# Patient Record
Sex: Female | Born: 1995 | Race: Black or African American | Hispanic: No | Marital: Single | Smoking: Never smoker
Health system: Southern US, Community
[De-identification: ages and names within clinical notes are randomized; demographics above are authoritative.]

---

## 2015-12-06 ENCOUNTER — Encounter (HOSPITAL_COMMUNITY): Payer: Self-pay | Admitting: Emergency Medicine

## 2015-12-06 ENCOUNTER — Emergency Department (HOSPITAL_COMMUNITY)
Admission: EM | Admit: 2015-12-06 | Discharge: 2015-12-06 | Disposition: A | Discharge status: Home / Self Care | Payer: BC Managed Care – PPO | Attending: Emergency Medicine | Admitting: Emergency Medicine

## 2015-12-06 ENCOUNTER — Emergency Department (HOSPITAL_COMMUNITY): Payer: BC Managed Care – PPO

## 2015-12-06 DIAGNOSIS — R05 Cough: Secondary | ICD-10-CM | POA: Diagnosis present

## 2015-12-06 DIAGNOSIS — J069 Acute upper respiratory infection, unspecified: Secondary | ICD-10-CM

## 2015-12-06 MED ORDER — BENZONATATE 100 MG PO CAPS
100.0000 mg | ORAL_CAPSULE | Freq: Once | ORAL | Status: AC
  Administered 2015-12-06: 100 mg via ORAL
  Filled 2015-12-06: qty 1

## 2015-12-06 MED ORDER — BENZONATATE 100 MG PO CAPS: 100.0000 mg | ORAL_CAPSULE | Freq: Three times a day (TID) | ORAL | Status: AC

## 2015-12-06 MED ORDER — GUAIFENESIN 100 MG/5ML PO LIQD: 100.0000 mg | ORAL | Status: AC | PRN

## 2015-12-06 NOTE — Discharge Instructions (Signed)
Upper Respiratory Infection, Adult Most upper respiratory infections (URIs) are a viral infection of the air passages leading to the lungs. A URI affects the nose, throat, and upper air passages. The most common type of URI is nasopharyngitis and is typically referred to as "the common cold." URIs run their course and usually go away on their own. Most of the time, a URI does not require medical attention, but sometimes a bacterial infection in the upper airways can follow a viral infection. This is called a secondary infection. Sinus and middle ear infections are common types of secondary upper respiratory infections. Bacterial pneumonia can also complicate a URI. A URI can worsen asthma and chronic obstructive pulmonary disease (COPD). Sometimes, these complications can require emergency medical care and may be life threatening.  CAUSES Almost all URIs are caused by viruses. A virus is a type of germ and can spread from one person to another.  RISKS FACTORS You may be at risk for a URI if:   You smoke.   You have chronic heart or lung disease.  You have a weakened defense (immune) system.   You are very young or very old.   You have nasal allergies or asthma.  You work in crowded or poorly ventilated areas.  You work in health care facilities or schools. SIGNS AND SYMPTOMS  Symptoms typically develop 2-3 days after you come in contact with a cold virus. Most viral URIs last 7-10 days. However, viral URIs from the influenza virus (flu virus) can last 14-18 days and are typically more severe. Symptoms may include:   Runny or stuffy (congested) nose.   Sneezing.   Cough.   Sore throat.   Headache.   Fatigue.   Fever.   Loss of appetite.   Pain in your forehead, behind your eyes, and over your cheekbones (sinus pain).  Muscle aches.  DIAGNOSIS  Your health care provider may diagnose a URI by:  Physical exam.  Tests to check that your symptoms are not due to  another condition such as:  Strep throat.  Sinusitis.  Pneumonia.  Asthma. TREATMENT  A URI goes away on its own with time. It cannot be cured with medicines, but medicines may be prescribed or recommended to relieve symptoms. Medicines may help:  Reduce your fever.  Reduce your cough.  Relieve nasal congestion. HOME CARE INSTRUCTIONS   Take medicines only as directed by your health care provider.   Gargle warm saltwater or take cough drops to comfort your throat as directed by your health care provider.  Use a warm mist humidifier or inhale steam from a shower to increase air moisture. This may make it easier to breathe.  Drink enough fluid to keep your urine clear or pale yellow.   Eat soups and other clear broths and maintain good nutrition.   Rest as needed.   Return to work when your temperature has returned to normal or as your health care provider advises. You may need to stay home longer to avoid infecting others. You can also use a face mask and careful hand washing to prevent spread of the virus.  Increase the usage of your inhaler if you have asthma.   Do not use any tobacco products, including cigarettes, chewing tobacco, or electronic cigarettes. If you need help quitting, ask your health care provider. PREVENTION  The best way to protect yourself from getting a cold is to practice good hygiene.   Avoid oral or hand contact with people with cold   symptoms.   Wash your hands often if contact occurs.  There is no clear evidence that vitamin C, vitamin E, echinacea, or exercise reduces the chance of developing a cold. However, it is always recommended to get plenty of rest, exercise, and practice good nutrition.  SEEK MEDICAL CARE IF:   You are getting worse rather than better.   Your symptoms are not controlled by medicine.   You have chills.  You have worsening shortness of breath.  You have brown or red mucus.  You have yellow or brown nasal  discharge.  You have pain in your face, especially when you bend forward.  You have a fever.  You have swollen neck glands.  You have pain while swallowing.  You have white areas in the back of your throat. SEEK IMMEDIATE MEDICAL CARE IF:   You have severe or persistent:  Headache.  Ear pain.  Sinus pain.  Chest pain.  You have chronic lung disease and any of the following:  Wheezing.  Prolonged cough.  Coughing up blood.  A change in your usual mucus.  You have a stiff neck.  You have changes in your:  Vision.  Hearing.  Thinking.  Mood. MAKE SURE YOU:   Understand these instructions.  Will watch your condition.  Will get help right away if you are not doing well or get worse.   This information is not intended to replace advice given to you by your health care provider. Make sure you discuss any questions you have with your health care provider.   Document Released: 02/17/2001 Document Revised: 01/08/2015 Document Reviewed: 11/29/2013 Elsevier Interactive Patient Education 2016 [employer]  

## 2015-12-06 NOTE — ED Notes (Addendum)
Verbalized understanding discharge instructions, prescriptions, and follow-up. In no acute distress.   Pt given work and school notes.

## 2015-12-06 NOTE — ED Notes (Signed)
Per pt, states cough which is making her "lungs hurt"-started yesterday

## 2015-12-06 NOTE — ED Provider Notes (Signed)
CSN: [id number]     Arrival date & time 12/06/15  [id number] History   First MD Initiated Contact with Patient 12/06/15 0848     Chief Complaint  Patient presents with  . Cough     Patient is a 20 y.o. female presenting with cough. The history is provided by the patient. No language interpreter was used.  Cough  Julia Lucas is a 20 y.o. female who presents to the Emergency Department complaining of cough.  She reports dry and nonproductive cough that started yesterday. She has pain in her lungs with coughing and occasionally with breathing across her upper central chest. She has intermittent runny nose. No fevers, vomiting, diarrhea, leg swelling. She does have some nausea and feels like she is going to throw up and she has long coughing fits. She does have coworkers that are sick with similar symptoms. No history of DVT/PE, no hormone use.  History reviewed. No pertinent past medical history. History reviewed. No pertinent past surgical history. No family history on file. Social History  Substance Use Topics  . Smoking status: Never Smoker   . Smokeless tobacco: None  . Alcohol Use: Yes   OB History    No data available     Review of Systems  Respiratory: Positive for cough.   All other systems reviewed and are negative.     Allergies  Other  Home Medications   Prior to Admission medications   Medication Sig Start Date End Date Taking? Authorizing Provider  Menthol (COUGH DROPS) 10 MG LOZG Use as directed 1 lozenge in the mouth or throat as needed (for cough).   Yes Historical Provider, MD  benzonatate (TESSALON) 100 MG capsule Take 1 capsule (100 mg total) by mouth every 8 (eight) hours. 12/06/15   Tilden FossaElizabeth Davonne Baby, MD  guaiFENesin (ROBITUSSIN) 100 MG/5ML liquid Take 5-10 mLs (100-200 mg total) by mouth every 4 (four) hours as needed for cough. 12/06/15   Tilden FossaElizabeth Lanyah Spengler, MD   BP 124/82 mmHg  Pulse 73  Temp(Src) 98.1 F (36.7 C) (Oral)  Resp 16  SpO2 98%  LMP  11/06/2015 Physical Exam  Constitutional: She is oriented to person, place, and time. She appears well-developed and well-nourished.  HENT:  Head: Normocephalic and atraumatic.  Mild erythema and tonsillar swelling in the posterior oropharynx.  Cardiovascular: Normal rate and regular rhythm.   No murmur heard. Pulmonary/Chest: Effort normal and breath sounds normal. No respiratory distress.  Abdominal: Soft. There is no tenderness. There is no rebound and no guarding.  Musculoskeletal: She exhibits no edema or tenderness.  Neurological: She is alert and oriented to person, place, and time.  Skin: Skin is warm and dry.  Psychiatric: She has a normal mood and affect. Her behavior is normal.  Nursing note and vitals reviewed.   ED Course  Procedures (including critical care time) Labs Review Labs Reviewed - No data to display  Imaging Review Dg Chest 2 View  12/06/2015  CLINICAL DATA:  Cough, mid chest pain since yesterday, nonsmoker. EXAM: CHEST  2 VIEW COMPARISON:  None in FINDINGS: The lungs are adequately inflated. There is no focal infiltrate. There is no pleural effusion. The heart and pulmonary vascularity are normal. The mediastinum is normal in width. The bony thorax exhibits no acute abnormality. IMPRESSION: There is no active cardiopulmonary disease. Electronically Signed   By: David  SwazilandJordan M.D.   On: 12/06/2015 09:16   I have personally reviewed and evaluated these images and lab results as part of my medical  decision-making.   EKG Interpretation None      MDM   Final diagnoses:  Acute URI    Patient here for evaluation of cough with chest discomfort. No evidence of pneumonia, asthma. Presentation is not consistent with PE, perc negative. Treating for URI with antitussives. Discussed home care, outpatient follow-up, return precautions.    Tilden Fossa, MD 12/06/15 [phone]

## 2016-09-18 IMAGING — CR DG CHEST 2V
2 series · 2 of 2 positions shown · non-contrast
Comparison: None in

CLINICAL DATA: Cough, mid chest pain since yesterday, nonsmoker.

EXAM:
CHEST  2 VIEW

[w chest pa]
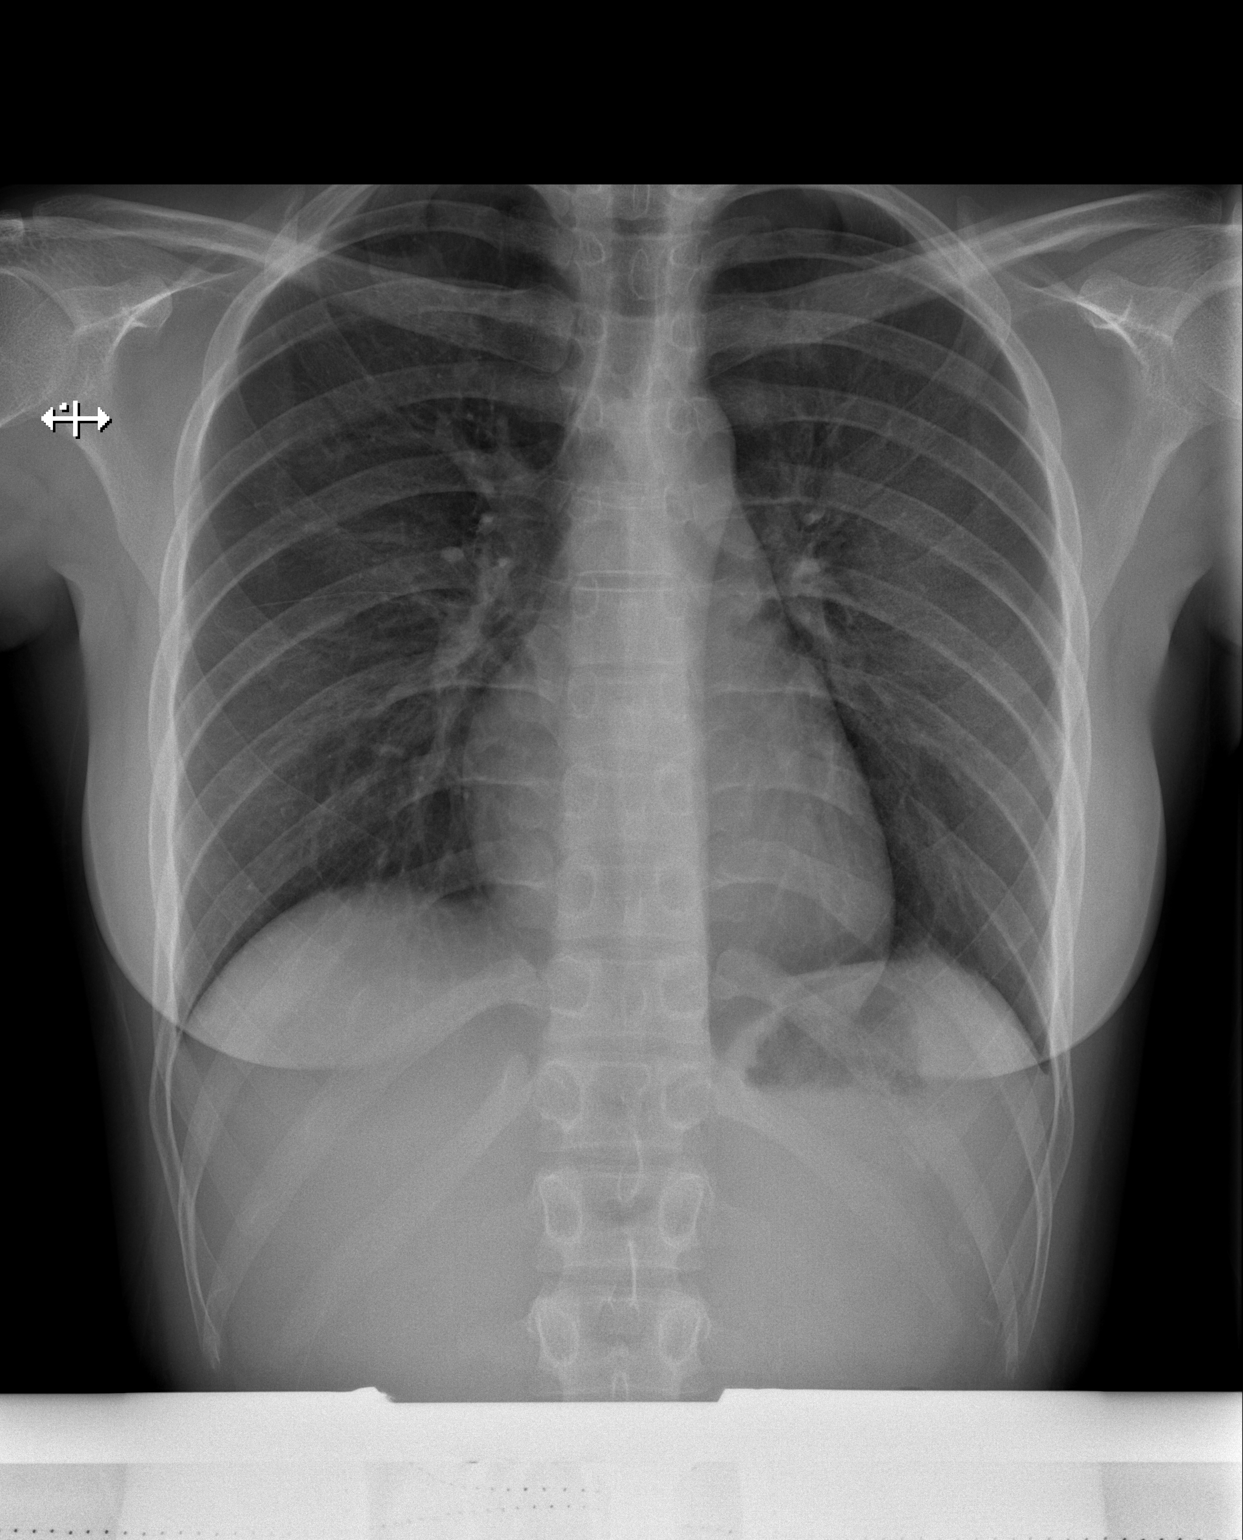

[w chest lat]
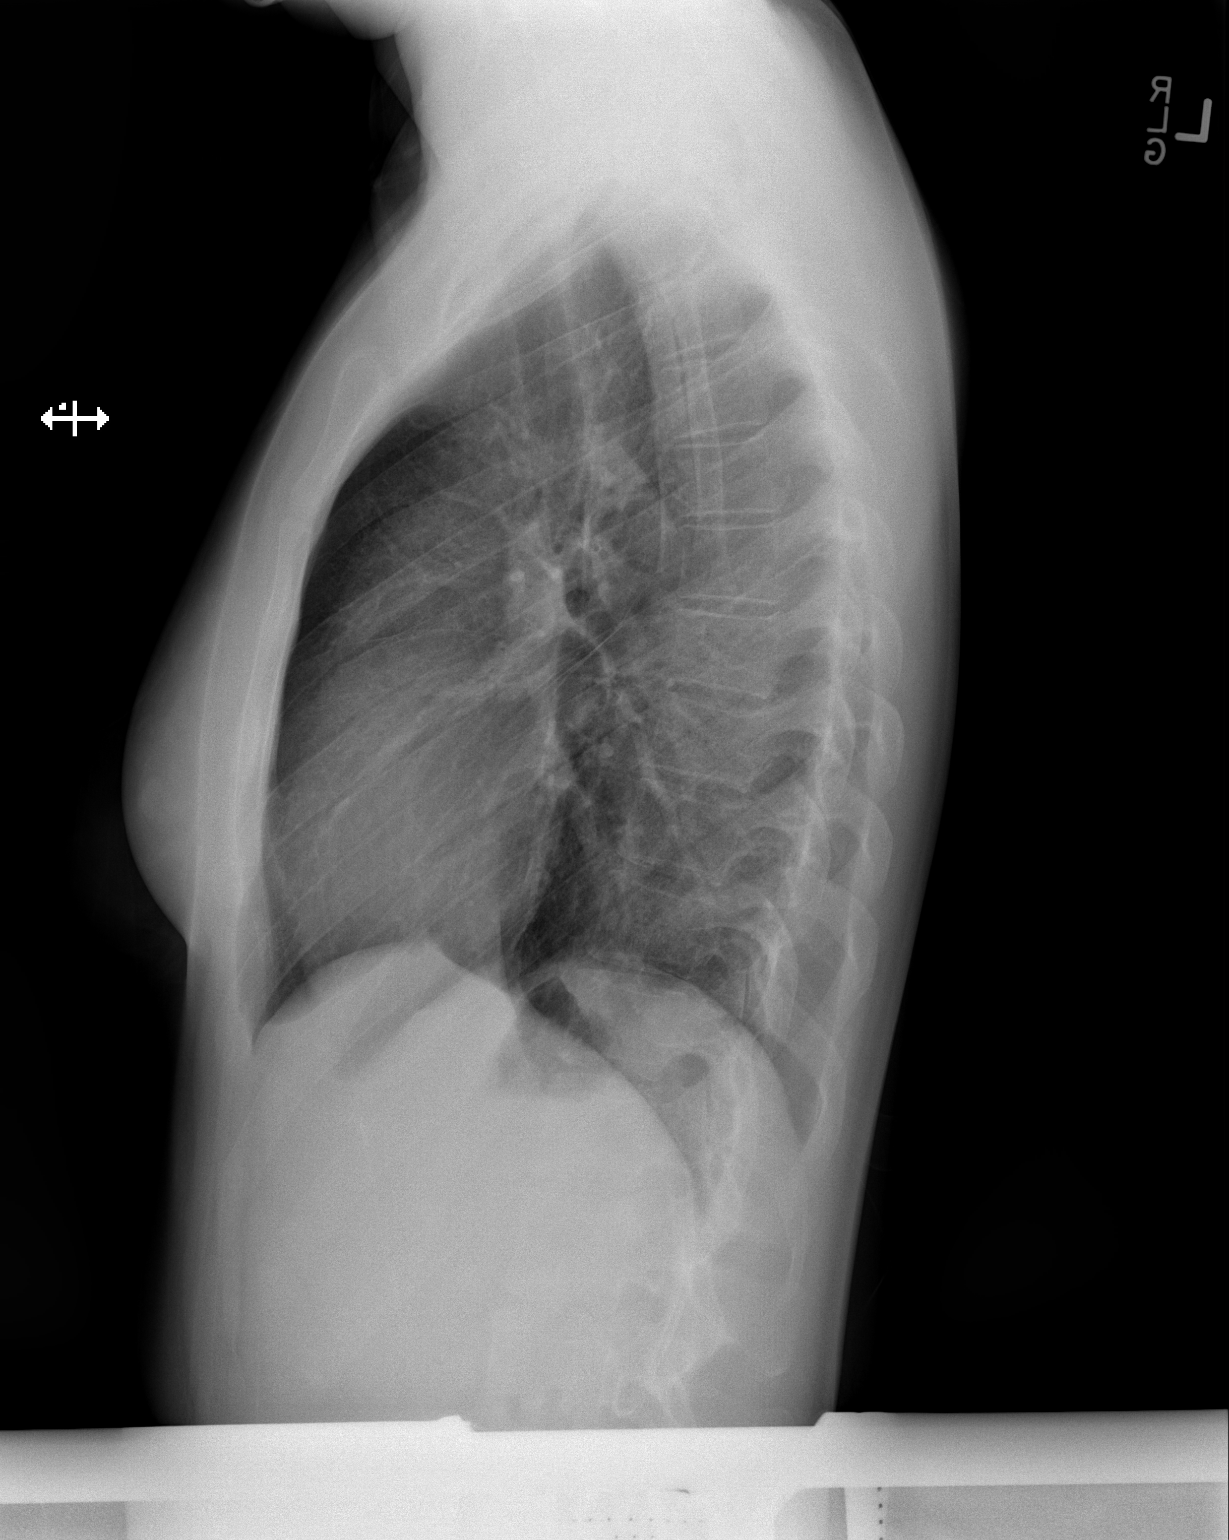

[2 of 2 positions shown; findings below may reference images not displayed]

FINDINGS: The lungs are adequately inflated. There is no focal infiltrate.
There is no pleural effusion. The heart and pulmonary vascularity
are normal. The mediastinum is normal in width. The bony thorax
exhibits no acute abnormality.
IMPRESSION: There is no active cardiopulmonary disease.
